# Patient Record
Sex: Male | Born: 1995 | Race: Asian | Hispanic: No | Marital: Single | Smoking: Former smoker
Health system: Southern US, Community
[De-identification: ages and names within clinical notes are randomized; demographics above are authoritative.]

## PROBLEM LIST (undated history)

## (undated) DIAGNOSIS — S060XAA Concussion with loss of consciousness status unknown, initial encounter: Secondary | ICD-10-CM

## (undated) DIAGNOSIS — S060X9A Concussion with loss of consciousness of unspecified duration, initial encounter: Secondary | ICD-10-CM

## (undated) DIAGNOSIS — S069XAA Unspecified intracranial injury with loss of consciousness status unknown, initial encounter: Secondary | ICD-10-CM

## (undated) DIAGNOSIS — R519 Headache, unspecified: Secondary | ICD-10-CM

## (undated) DIAGNOSIS — R51 Headache: Secondary | ICD-10-CM

## (undated) DIAGNOSIS — S069X9A Unspecified intracranial injury with loss of consciousness of unspecified duration, initial encounter: Secondary | ICD-10-CM

## (undated) HISTORY — DX: Unspecified intracranial injury with loss of consciousness of unspecified duration, initial encounter: S06.9X9A

## (undated) HISTORY — DX: Headache, unspecified: R51.9

## (undated) HISTORY — DX: Concussion with loss of consciousness of unspecified duration, initial encounter: S06.0X9A

## (undated) HISTORY — DX: Headache: R51

## (undated) HISTORY — DX: Concussion with loss of consciousness status unknown, initial encounter: S06.0XAA

## (undated) HISTORY — DX: Unspecified intracranial injury with loss of consciousness status unknown, initial encounter: S06.9XAA

---

## 2014-06-13 ENCOUNTER — Emergency Department (HOSPITAL_COMMUNITY): Payer: No Typology Code available for payment source

## 2014-06-13 ENCOUNTER — Emergency Department (HOSPITAL_COMMUNITY)
Admission: EM | Admit: 2014-06-13 | Discharge: 2014-06-13 | Disposition: A | Discharge status: Home / Self Care | Payer: No Typology Code available for payment source | Attending: Emergency Medicine | Admitting: Emergency Medicine

## 2014-06-13 ENCOUNTER — Encounter (HOSPITAL_COMMUNITY): Payer: Self-pay | Admitting: Emergency Medicine

## 2014-06-13 DIAGNOSIS — S062X0A Diffuse traumatic brain injury without loss of consciousness, initial encounter: Secondary | ICD-10-CM | POA: Insufficient documentation

## 2014-06-13 DIAGNOSIS — Z87891 Personal history of nicotine dependence: Secondary | ICD-10-CM | POA: Insufficient documentation

## 2014-06-13 DIAGNOSIS — S0990XA Unspecified injury of head, initial encounter: Secondary | ICD-10-CM | POA: Insufficient documentation

## 2014-06-13 DIAGNOSIS — S06310A Contusion and laceration of right cerebrum without loss of consciousness, initial encounter: Secondary | ICD-10-CM

## 2014-06-13 DIAGNOSIS — Y9389 Activity, other specified: Secondary | ICD-10-CM | POA: Insufficient documentation

## 2014-06-13 DIAGNOSIS — Y92410 Unspecified street and highway as the place of occurrence of the external cause: Secondary | ICD-10-CM | POA: Insufficient documentation

## 2014-06-13 MED ORDER — HYDROCODONE-ACETAMINOPHEN 5-325 MG PO TABS: 1.0000 | ORAL_TABLET | Freq: Four times a day (QID) | ORAL | Status: DC | PRN

## 2014-06-13 MED ORDER — CYCLOBENZAPRINE HCL 10 MG PO TABS: 10.0000 mg | ORAL_TABLET | Freq: Two times a day (BID) | ORAL | Status: DC | PRN

## 2014-06-13 NOTE — ED Notes (Signed)
Transported to radiology 
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
## 2014-06-13 NOTE — Discharge Instructions (Signed)
Concussion °A concussion, or closed-head injury, is a brain injury caused by a direct blow to the head or by a quick and sudden movement (jolt) of the head or neck. Concussions are usually not life-threatening. Even so, the effects of a concussion can be serious. If you have had a concussion before, you are more likely to experience concussion-like symptoms after a direct blow to the head.  °CAUSES °· Direct blow to the head, such as from running into another player during a soccer game, being hit in a fight, or hitting your head on a hard surface. °· A jolt of the head or neck that causes the brain to move back and forth inside the skull, such as in a car crash. °SIGNS AND SYMPTOMS °The signs of a concussion can be hard to notice. Early on, they may be missed by you, family members, and health care providers. You may look fine but act or feel differently. °Symptoms are usually temporary, but they may last for days, weeks, or even longer. Some symptoms may appear right away while others may not show up for hours or days. Every head injury is different. Symptoms include: °· Mild to moderate headaches that will not go away. °· A feeling of pressure inside your head. °· Having more trouble than usual: °¨ Learning or remembering things you have heard. °¨ Answering questions. °¨ Paying attention or concentrating. °¨ Organizing daily tasks. °¨ Making decisions and solving problems. °· Slowness in thinking, acting or reacting, speaking, or reading. °· Getting lost or being easily confused. °· Feeling tired all the time or lacking energy (fatigued). °· Feeling drowsy. °· Sleep disturbances. °¨ Sleeping more than usual. °¨ Sleeping less than usual. °¨ Trouble falling asleep. °¨ Trouble sleeping (insomnia). °· Loss of balance or feeling lightheaded or dizzy. °· Nausea or vomiting. °· Numbness or tingling. °· Increased sensitivity to: °¨ Sounds. °¨ Lights. °¨ Distractions. °· Vision problems or eyes that tire  easily. °· Diminished sense of taste or smell. °· Ringing in the ears. °· Mood changes such as feeling sad or anxious. °· Becoming easily irritated or angry for little or no reason. °· Lack of motivation. °· Seeing or hearing things other people do not see or hear (hallucinations). °DIAGNOSIS °Your health care provider can usually diagnose a concussion based on a description of your injury and symptoms. He or she will ask whether you passed out (lost consciousness) and whether you are having trouble remembering events that happened right before and during your injury. °Your evaluation might include: °· A brain scan to look for signs of injury to the brain. Even if the test shows no injury, you may still have a concussion. °· Blood tests to be sure other problems are not present. °TREATMENT °· Concussions are usually treated in an emergency department, in urgent care, or at a clinic. You may need to stay in the hospital overnight for further treatment. °· Tell your health care provider if you are taking any medicines, including prescription medicines, over-the-counter medicines, and natural remedies. Some medicines, such as blood thinners (anticoagulants) and aspirin, may increase the chance of complications. Also tell your health care provider whether you have had alcohol or are taking illegal drugs. This information may affect treatment. °· Your health care provider will send you home with important instructions to follow. °· How fast you will recover from a concussion depends on many factors. These factors include how severe your concussion is, what part of your brain was injured, your   age, and how healthy you were before the concussion. °· Most people with mild injuries recover fully. Recovery can take time. In general, recovery is slower in older persons. Also, persons who have had a concussion in the past or have other medical problems may find that it takes longer to recover from their current injury. °HOME  CARE INSTRUCTIONS °General Instructions °· Carefully follow the directions your health care provider gave you. °· Only take over-the-counter or prescription medicines for pain, discomfort, or fever as directed by your health care provider. °· Take only those medicines that your health care provider has approved. °· Do not drink alcohol until your health care provider says you are well enough to do so. Alcohol and certain other drugs may slow your recovery and can put you at risk of further injury. °· If it is harder than usual to remember things, write them down. °· If you are easily distracted, try to do one thing at a time. For example, do not try to watch TV while fixing dinner. °· Talk with family members or close friends when making important decisions. °· Keep all follow-up appointments. Repeated evaluation of your symptoms is recommended for your recovery. °· Watch your symptoms and tell others to do the same. Complications sometimes occur after a concussion. Older adults with a brain injury may have a higher risk of serious complications, such as a blood clot on the brain. °· Tell your teachers, school nurse, school counselor, coach, athletic trainer, or work manager about your injury, symptoms, and restrictions. Tell them about what you can or cannot do. They should watch for: °¨ Increased problems with attention or concentration. °¨ Increased difficulty remembering or learning new information. °¨ Increased time needed to complete tasks or assignments. °¨ Increased irritability or decreased ability to cope with stress. °¨ Increased symptoms. °· Rest. Rest helps the brain to heal. Make sure you: °¨ Get plenty of sleep at night. Avoid staying up late at night. °¨ Keep the same bedtime hours on weekends and weekdays. °¨ Rest during the day. Take daytime naps or rest breaks when you feel tired. °· Limit activities that require a lot of thought or concentration. These include: °¨ Doing homework or job-related  work. °¨ Watching TV. °¨ Working on the computer. °· Avoid any situation where there is potential for another head injury (football, hockey, soccer, basketball, martial arts, downhill snow sports and horseback riding). Your condition will get worse every time you experience a concussion. You should avoid these activities until you are evaluated by the appropriate follow-up health care providers. °Returning To Your Regular Activities °You will need to return to your normal activities slowly, not all at once. You must give your body and brain enough time for recovery. °· Do not return to sports or other athletic activities until your health care provider tells you it is safe to do so. °· Ask your health care provider when you can drive, ride a bicycle, or operate heavy machinery. Your ability to react may be slower after a brain injury. Never do these activities if you are dizzy. °· Ask your health care provider about when you can return to work or school. °Preventing Another Concussion °It is very important to avoid another brain injury, especially before you have recovered. In rare cases, another injury can lead to permanent brain damage, brain swelling, or death. The risk of this is greatest during the first 7-10 days after a head injury. Avoid injuries by: °· Wearing a seat   belt when riding in a car. °· Drinking alcohol only in moderation. °· Wearing a helmet when biking, skiing, skateboarding, skating, or doing similar activities. °· Avoiding activities that could lead to a second concussion, such as contact or recreational sports, until your health care provider says it is okay. °· Taking safety measures in your home. °¨ Remove clutter and tripping hazards from floors and stairways. °¨ Use grab bars in bathrooms and handrails by stairs. °¨ Place non-slip mats on floors and in bathtubs. °¨ Improve lighting in dim areas. °SEEK MEDICAL CARE IF: °· You have increased problems paying attention or  concentrating. °· You have increased difficulty remembering or learning new information. °· You need more time to complete tasks or assignments than before. °· You have increased irritability or decreased ability to cope with stress. °· You have more symptoms than before. °Seek medical care if you have any of the following symptoms for more than 2 weeks after your injury: °· Lasting (chronic) headaches. °· Dizziness or balance problems. °· Nausea. °· Vision problems. °· Increased sensitivity to noise or light. °· Depression or mood swings. °· Anxiety or irritability. °· Memory problems. °· Difficulty concentrating or paying attention. °· Sleep problems. °· Feeling tired all the time. °SEEK IMMEDIATE MEDICAL CARE IF: °· You have severe or worsening headaches. These may be a sign of a blood clot in the brain. °· You have weakness (even if only in one hand, leg, or part of the face). °· You have numbness. °· You have decreased coordination. °· You vomit repeatedly. °· You have increased sleepiness. °· One pupil is larger than the other. °· You have convulsions. °· You have slurred speech. °· You have increased confusion. This may be a sign of a blood clot in the brain. °· You have increased restlessness, agitation, or irritability. °· You are unable to recognize people or places. °· You have neck pain. °· It is difficult to wake you up. °· You have unusual behavior changes. °· You lose consciousness. °MAKE SURE YOU: °· Understand these instructions. °· Will watch your condition. °· Will get help right away if you are not doing well or get worse. °Document Released: 11/19/2003 Document Revised: 09/03/2013 Document Reviewed: 03/21/2013 °ExitCare® Patient Information ©2015 [employer]. This information is not intended to replace advice given to you by your health care provider. Make sure you discuss any questions you have with your health care provider. ° ° °Emergency Department Resource Guide °1) Find a Doctor and Pay  Out of Pocket °Although you won't have to find out who is covered by your insurance plan, it is a good idea to ask around and get recommendations. You will then need to call the office and see if the doctor you have chosen will accept you as a new patient and what types of options they offer for patients who are self-pay. Some doctors offer discounts or will set up payment plans for their patients who do not have insurance, but you will need to ask so you aren't surprised when you get to your appointment. ° °2) Contact Your Local Health Department °Not all health departments have doctors that can see patients for sick visits, but many do, so it is worth a call to see if yours does. If you don't know where your local health department is, you can check in your phone book. The CDC also has a tool to help you locate your state's health department, and many state websites also have listings of   all of their local health departments. ° °3) Find a Walk-in Clinic °If your illness is not likely to be very severe or complicated, you may want to try a walk in clinic. These are popping up all over the country in pharmacies, drugstores, and shopping centers. They're usually staffed by nurse practitioners or physician assistants that have been trained to treat common illnesses and complaints. They're usually fairly quick and inexpensive. However, if you have serious medical issues or chronic medical problems, these are probably not your best option. ° °No Primary Care Doctor: °- Call [facility] at  832-8000 - they can help you locate a primary care doctor that  accepts your insurance, provides certain services, etc. °- Physician Referral Service- 1-800-533-3463 ° °Chronic Pain Problems: °Organization         Address  Phone   Notes  °[facility]  (336) 297-2271 Patients need to be referred by their primary care doctor.  ° °Medication Assistance: °Organization         Address  Phone   Notes  °[place]  [facility] [address] °[place], [place] [address] (336) 641-8030 --Must be a resident of [place] °-- Must have NO insurance coverage whatsoever (no Medicaid/ Medicare, etc.) °-- The pt. MUST have a primary care doctor that directs their care regularly and follows them in the community °  °[facility]  (866) 331-1348   °[employer]  (888) 892-1162   ° °Agencies that provide inexpensive medical care: °Organization         Address  Phone   Notes  °[facility]  (336) 832-8035   °[facility]    (336) 832-7272   °[facility] [address] °[place], [place] [address] (336) 832-4777   °[facility] [address], °[place] (336) 271-4999   °[facility]    (336) 373-0678   °[facility]    (336) 272-1050   °[facility] ° [address], [place] Phone:  (336) 832-4444, Fax:  (336) 832-4440 Hours of Operation:  9 am - 6 pm, M-F.  Also accepts Medicaid/Medicare and self-pay.  °[facility] ° [address], [place] Phone: (336) 832-3150, Fax: (336) [phone]. Hours of Operation:  8:30 am - 5:30 pm, M-F.  Also accepts Medicaid and self-pay.  °[facility] [place] [address], [place] Phone: (336) 878-6027   °[facility] [address], [place], [place] (336)723-1848, Ext. 123 Mondays & Thursdays: 7-9 AM.  First 15 patients are seen on a first come, first serve basis. °  ° °Medicaid-accepting [place] Providers: ° °Organization         Address  Phone   Notes  °[facility] [address], [place] (336) 641-2100 Also accepts self-pay patients.  °[facility] [address], [place] ° (336) 856-9996   °[facility] [address], [place] (336) 288-8857   °[facility] [address] [place]  [address], [place] (336) 299-7000   °Veita Bland [address],   ° (336) 373-1557 Only accepts [place] Access Medicaid patients after they have their name applied to their card.  ° °Self-Pay (no insurance) in [place]: ° °Organization         Address  Phone   Notes  °  Sickle Cell Patients, [facility] [address], [place] (336) 832-1970   °[facility] [address], [place] (336) 832-4400   °[facility] ° [address], [place] (336) 992-4800   °[facility]/Dr. Osei-Bonsu ° [address] [place] [address], [place] or [address], [place] (336) [phone] Phone number for both [place] and [place] locations is the same.  °[facility] [address], [place] (336) 299-0000   °[facility] [address] [place] [address], [place] or [address] (336) 852-7530 °(336) 878-2260   °[facility] [address], [place] (336) 350-1642, phone; (336) 294-5005, fax Sees patients 1st and 3rd Saturday of every month.  Must not qualify for public or private insurance (i.e. Medicaid, Medicare, [place] Health Choice, Veterans' Benefits) • Household income should be no more than 200% of the poverty level •The clinic cannot treat you if you are pregnant or think you are pregnant • Sexually transmitted diseases are not treated at the clinic.  ° ° °Dental Care: °Organization         Address  Phone  Notes  °[place] [facility] [address], [place] (336) 641-6152 Accepts children up to age 21 who are enrolled in Medicaid or [place] Health Choice; pregnant women with a Medicaid card; and children who have applied for Medicaid or [place] Health Choice, but were declined, whose parents can pay a reduced fee at time of service.  °[facility] [place]  [address], [place]  (336) 641-7733 Accepts children up to age 21 who are enrolled in Medicaid or [place] Health Choice; pregnant women with a Medicaid card; and children who have applied for Medicaid or [place] Health Choice, but were declined, whose parents can pay a reduced fee at time of service.  °[facility] ° [address], [place] (336) 641-4533 Patients are seen by appointment only. Walk-ins are not accepted. [facility] will see patients 18 years of age and older. °Monday - Tuesday (8am-5pm) °Most Wednesdays (8:30-5pm) °$30 per visit, cash only  °[facility] ° [address], [place] (336) [phone] Patients are seen by appointment only. Walk-ins are not accepted. [facility] will see patients 18 years of age and older. °One Wednesday Evening (Monthly: Volunteer Based).  $30 per visit, cash only  °[facility]  (919) 537-3737 for adults; Children under age 4, call Graduate Pediatric Dentistry at ([phone]) [phone]. Children aged 4-14, please call ([phone]) [phone] to request a pediatric application. ° Dental services are provided in all areas of dental care including fillings, crowns and bridges, complete and partial dentures, implants, gum treatment, root canals, and extractions. Preventive care is also provided. Treatment is provided to both adults and children. °Patients are selected via a lottery and there is often a waiting list. °  °[facility] [address], °[place] ° (336) 763-8833 [email or web address] °  °[facility] [address], [place], [place] (336)723-1848, Ext. 123 Second and Fourth Thursday of each month, opens at 6:30 AM; Clinic ends at 9 AM.  Patients are seen on a first-come first-served basis, and a limited number are seen during each clinic.  ° °[facility] ° [address], [place],  (336) [phone]   Eligibility Requirements °  You must have lived in [place], [place], or [place]  counties for at least the last three months. °  You cannot be eligible for state or federal sponsored healthcare insurance, including Veterans Administration, Medicaid, or Medicare. °  You generally cannot be eligible for healthcare insurance through your employer.  °  How to apply: °Eligibility screenings are held every Tuesday and Wednesday afternoon from 1:00 pm until 4:00 pm. You do not need an appointment for the interview!  °[facility] [address], [place], [place] 336-631-2330   °[facility]  336-342-8273   °[facility]  336-703-3100   °[facility]  336-570-6415   ° °Behavioral Health Resources in the Community: °Intensive Outpatient Programs °Organization         Address  Phone  Notes  °[facility] [address], [place], [place] 336-878-6098   °[facility] [address], [place], [place] 336-832-9800   °[facility] [address], [place], [place] ° 336-882-2125   °[facility] [address],  °[place], [place] 1-800-853-5163 or 336-641-4981   °Substance Abuse Resources °Organization         Address  Phone  Notes  °[facility]  336-882-2125   °[facility]  336-784-9470   °[facility]  336-285-9073   °[facility]  336-845-3988   °Residential & Outpatient Substance Abuse Program  1-800-659-3381   °Psychological Services °Organization         Address  Phone  Notes  °[facility]  336- 832-9600   °[facility]  336- 378-7881   °[facility] [address], [place] 1-800-853-5163 or 336-641-4981   ° °Mobile Crisis Teams °Organization         Address  Phone  Notes  °[facility], Mobile Crisis Care Unit  1-877-626-1772   °Assertive °[facility] ° [address] [place], [place] 336-834-9664   °Sharon DeEsch [address] °[place]  [place] 336-554-5454   ° °Self-Help/Support Groups °Organization         Address  Phone             Notes  °[facility] - variety of support groups  336- 373-1402 Call for more information  °Narcotics Anonymous (NA), [facility] [address], °[place]  2 meetings at this location  ° °Residential Treatment Programs °Organization         Address  Phone  Notes  °[facility] [address],    °[place]  1-866-801-8205   °[facility] ° [address], [place], [place] 704-293-8524   °[facility] [address], [place] 336-845-3988 Admissions: 8am-3pm M-F  °[facility] [address]    °[place], [place] 336-841-1104   °[facility] [address], [place], [place] 336-379-7146   °[facility] [address]  °[place], [place] 336-285-9073   °[facility] Intensive Outpatient [address], [place], [place] 336-852-3033   °[facility] [address]  °[place], [place] 1-877-615-2722 or 336-784-9470   °[facility] ([facility] [address] [place], [place] 336-227-7417 Accepts Medicaid  °[facility] [address]  ° [place] 1-800-659-3381 Substance Abuse/Addiction Treatment  ° °[place] Behavioral Health Resources °Organization           Address  Phone  Notes  °[facility]  (888) 581-9988   °Julie Brannon, PhD [address] [place], [place]   (336) 349-5553 or (336) 951-0000   °[facility]   [address] °[place], [place] (336) 349-4454   °[facility] [address], [place], [place] (336) 342-8316 Insurance/Medicaid/sponsorship through [facility]  °[facility] [address]                                    [place], [place] (336) 342-8316 Therapy/tele-psych/case  °[facility] [address]  ° [place], [place] (336) 349-2233    °Dr. Arfeen  (336)  349-4544   °[facility] [place]  [employer] [place] [facility] 1) [address], [place] °2) [address], [place] °3)  [address], [place] (336) 349-3220 °(336) 342-7768 ° °(336) 342-8140   °[place] [facility] (336) 342-1394 or (336) 342-3537 (After Hours)    ° ° ° °

## 2014-06-13 NOTE — ED Notes (Signed)
Pt A&OX4, ambulatory at d/c with steady gait, NAD 

## 2014-08-18 ENCOUNTER — Ambulatory Visit: Payer: Self-pay | Admitting: Neurology

## 2014-08-27 ENCOUNTER — Encounter: Payer: Self-pay | Admitting: Neurology

## 2014-08-27 ENCOUNTER — Ambulatory Visit (INDEPENDENT_AMBULATORY_CARE_PROVIDER_SITE_OTHER): Payer: BC Managed Care – PPO | Admitting: Neurology

## 2014-08-27 VITALS — BP 135/85 | HR 84 | Resp 12 | Ht 72.0 in | Wt 196.0 lb

## 2014-08-27 DIAGNOSIS — R51 Headache: Secondary | ICD-10-CM

## 2014-08-27 DIAGNOSIS — S060X0A Concussion without loss of consciousness, initial encounter: Secondary | ICD-10-CM | POA: Insufficient documentation

## 2014-08-27 DIAGNOSIS — S069X0A Unspecified intracranial injury without loss of consciousness, initial encounter: Secondary | ICD-10-CM

## 2014-08-27 DIAGNOSIS — S060X0S Concussion without loss of consciousness, sequela: Secondary | ICD-10-CM

## 2014-08-27 DIAGNOSIS — R519 Headache, unspecified: Secondary | ICD-10-CM

## 2014-08-27 HISTORY — DX: Headache, unspecified: R51.9

## 2014-08-27 MED ORDER — DONEPEZIL HCL 5 MG PO TABS: 5.0000 mg | ORAL_TABLET | Freq: Every day | ORAL | Status: AC

## 2014-08-27 MED ORDER — ZOLPIDEM TARTRATE ER 6.25 MG PO TBCR: 6.2500 mg | EXTENDED_RELEASE_TABLET | Freq: Every evening | ORAL | Status: AC | PRN
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
## 2014-08-27 NOTE — Patient Instructions (Signed)
Concussion °Direct trauma to the head often causes a condition known as a concussion. This injury can temporarily interfere with brain function and may cause you to pass out (lose consciousness). The consequences of a concussion are usually short-term, but repetitive concussions can be very dangerous. If you have multiple concussions, you will have a greater risk of long-term effects, such as slurred speech, slow movements, impaired thinking, or tremors. The severity of a concussion is based on the length and severity of the interference with brain activity. °SYMPTOMS  °Symptoms of a concussion vary depending on the severity of the injury. Very mild concussions may even occur without any noticeable symptoms. Swelling in the area of the injury is not related to the seriousness of the injury.  °· Mild concussion: °¨ Temporary loss of consciousness may or may not occur. °¨ Memory loss (amnesia) for a short time. °¨ Emotional instability. °¨ Confusion. °· Severe concussion: °¨ Usually prolonged loss of consciousness. °¨ Confusion °¨ One pupil (the black part in the middle of the eye) is larger than the other. °¨ Changes in vision (including blurring). °¨ Changes in breathing. °¨ Disturbed balance (equilibrium). °¨ Headaches. °¨ Confusion. °¨ Nausea or vomiting. °¨ Slower reaction time than normal. °¨ Difficulty learning and remembering things you have heard. °CAUSES  °A concussion is the result of trauma to the head. When the head is subjected to such an injury, the brain strikes against the inner wall of the skull. This impact is what causes the damage to the brain. The force of injury is related to severity of injury. The most severe concussions are associated with incidents that involve large impact forces such as motor vehicle accidents. Wearing a helmet will reduce the severity of trauma to the head, but concussions may still occur if you are wearing a helmet. °RISK INCREASES WITH: °· Contact sports (football,  hockey, soccer, rugby, basketball or lacrosse). °· Fighting sports (martial arts or boxing). °· Riding bicycles, motorcycles, or horses (when you ride without a helmet). °PREVENTION °· Wear proper protective headgear and ensure correct fit. °· Wear seat belts when driving and riding in a car. °· Do not drink or use mind-altering drugs and drive. °PROGNOSIS  °Concussions are typically curable if they are recognized and treated early. If a severe concussion or multiple concussions go untreated, then the complications may be life-threatening or cause permanent disability and brain damage. °RELATED COMPLICATIONS  °· Permanent brain damage (slurred speech, slow movement, impaired thinking, or tremors). °· Bleeding under the skull (subdural hemorrhage or hematoma, epidural hematoma). °· Bleeding into the brain. °· Prolonged healing time if usual activities are resumed too soon. °· Infection if skin over the concussion site is broken. °· Increased risk of future concussions (less trauma is required for a second concussion than the first). °TREATMENT  °Treatment initially requires immediate evaluation to determine the severity of the concussion. Occasionally, a hospital stay may be required for observation and treatment.  °Avoid exertion. Bed rest for the first 24-48 hours is recommended.  °Return to play is a controversial subject due to the increased risk for future injury as well as permanent disability and should be discussed at length with your treating caregiver. Many factors such as the severity of the concussion and whether this is the first, second, or third concussion play a role in timing a patient's return to sports.  °MEDICATION  °Do not give any medicine, including non-prescription acetaminophen or aspirin, until the diagnosis is certain. These medicines may mask developing   symptoms.  °SEEK IMMEDIATE MEDICAL CARE IF:  °· Symptoms get worse or do not improve in 24 hours. °· Any of the following symptoms  occur: °¨ Vomiting. °¨ The inability to move arms and legs equally well on both sides. °¨ Fever. °¨ Neck stiffness. °¨ Pupils of unequal size, shape, or reactivity. °¨ Convulsions. °¨ Noticeable restlessness. °¨ Severe headache that persists for longer than 4 hours after injury. °¨ Confusion, disorientation, or mental status changes. °Document Released: 08/29/2005 Document Revised: 06/19/2013 Document Reviewed: 12/11/2008 °Exit[employer]® Patient Information ©2015 [employer]. This information is not intended to replace advice given to you by your health care provider. Make sure you discuss any questions you have with your health care provider. ° °

## 2014-08-28 LAB — COMPREHENSIVE METABOLIC PANEL
ALBUMIN: 4.5 g/dL (ref 3.5–5.5)
ALT: 28 IU/L (ref 0–44)
AST: 28 IU/L (ref 0–40)
Albumin/Globulin Ratio: 1.6 (ref 1.1–2.5)
Alkaline Phosphatase: 62 IU/L (ref 39–117)
BUN / CREAT RATIO: 13 (ref 8–19)
BUN: 13 mg/dL (ref 6–20)
CALCIUM: 9.4 mg/dL (ref 8.7–10.2)
CHLORIDE: 101 mmol/L (ref 97–108)
CO2: 24 mmol/L (ref 18–29)
CREATININE: 0.98 mg/dL (ref 0.76–1.27)
GFR calc Af Amer: 129 mL/min/{1.73_m2} (ref >59)
GFR calc non Af Amer: 111 mL/min/{1.73_m2} (ref >59)
GLOBULIN, TOTAL: 2.9 g/dL (ref 1.5–4.5)
GLUCOSE: 93 mg/dL (ref 65–99)
Potassium: 4 mmol/L (ref 3.5–5.2)
Sodium: 138 mmol/L (ref 134–144)
TOTAL PROTEIN: 7.4 g/dL (ref 6.0–8.5)
Total Bilirubin: 0.6 mg/dL (ref 0.0–1.2)

## 2014-08-29 ENCOUNTER — Encounter: Payer: Self-pay | Admitting: *Deleted

## 2014-10-10 ENCOUNTER — Ambulatory Visit: Payer: BC Managed Care – PPO | Admitting: Neurology
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
